# Patient Record
Sex: Male | Born: 1995 | Race: White | Hispanic: No | Marital: Single | Smoking: Never smoker
Health system: Southern US, Community
[De-identification: ages and names within clinical notes are randomized; demographics above are authoritative.]

---
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
## 2020-02-08 ENCOUNTER — Other Ambulatory Visit: Payer: Self-pay

## 2020-02-08 ENCOUNTER — Ambulatory Visit
Admission: EM | Admit: 2020-02-08 | Discharge: 2020-02-08 | Disposition: A | Discharge status: Home / Self Care | Payer: Commercial Managed Care - PPO | Attending: Emergency Medicine | Admitting: Emergency Medicine

## 2020-02-08 DIAGNOSIS — L237 Allergic contact dermatitis due to plants, except food: Secondary | ICD-10-CM

## 2020-02-08 MED ORDER — PREDNISONE 10 MG (21) PO TBPK: ORAL_TABLET | Freq: Every day | ORAL | 0 refills | Status: AC

## 2020-02-08 MED ORDER — CLOTRIMAZOLE 1 % EX CREA: TOPICAL_CREAM | CUTANEOUS | 0 refills | Status: AC

## 2020-02-08 MED ORDER — TRIAMCINOLONE ACETONIDE 0.1 % EX CREA: 1.0000 "application " | TOPICAL_CREAM | Freq: Two times a day (BID) | CUTANEOUS | 0 refills | Status: AC

## 2020-02-08 NOTE — ED Provider Notes (Signed)
[facility]    CSN: [id number] Arrival date & time: 02/08/20  1126      History   Chief Complaint Chief Complaint  Patient presents with  . Rash    HPI Jerry Mayer is a 24 y.o. male  Presenting for poison oak dermatitis.  States he was working in the woods last week: Initially evaluated given steroid shot.  Took "a few pills of Benadryl once daily thereafter.  States he is taking prednisone before and usually "feels something "when he takes it.  Unsure of the dose.  Has finished prednisone course a few days ago.  Having itching all over face, inner thighs.  Denies penile or testicular pain, discharge.  No fever, arthralgias, myalgias.  Did not wash dogs or all linens after this initially occurred.  Denies chest pain, palpitations, difficulty breathing.  History reviewed. No pertinent past medical history.  Patient Active Problem List   Diagnosis Date Noted  . Short stature 08/27/2010  . 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 No pertinent surgical history.     Home Medications    Prior to Admission medications   Medication Sig Start Date End Date Taking? Authorizing Provider  clotrimazole (LOTRIMIN) 1 % cream Apply to affected area 2 times daily 02/08/20   Hall-Potvin, [place], PA-C  methylphenidate (CONCERTA) 18 MG CR tablet Take 18 mg by mouth every morning.      [provider]  predniSONE (STERAPRED UNI-PAK 21 TAB) 10 MG (21) TBPK tablet Take by mouth daily. Take steroid taper as written 02/08/20   Hall-Potvin, [place], PA-C  triamcinolone cream (KENALOG) 0.1 % Apply 1 application topically 2 (two) times daily. 02/08/20   Hall-Potvin, [place], PA-C    Family History Family History  Problem Relation Age of Onset  . Healthy Mother   . Healthy Father     Social History Social History   Tobacco Use  . Smoking status: Never Smoker  . Smokeless  tobacco: Never Used  Substance Use Topics  . Alcohol use: Yes    Comment: occ  . Drug use: Not on file     Allergies   Patient has no known allergies.   Review of Systems As per HPI   Physical Exam Triage Vital Signs ED Triage Vitals  Enc Vitals Group     BP      Pulse      Resp      Temp      Temp src      SpO2      Weight      Height      Head Circumference      Peak Flow      Pain Score      Pain Loc      Pain Edu?      Excl. in GC?    No data found.  Updated Vital Signs BP 126/77 (BP Location: Left Arm)   Pulse 62   Temp 98.4 F (36.9 C) (Oral)   Resp 16   SpO2 100%   Visual Acuity Right Eye Distance:   Left Eye Distance:   Bilateral Distance:    Right Eye Near:   Left Eye Near:    Bilateral Near:     Physical Exam Constitutional:      General: He is not in acute distress. HENT:     Head: Normocephalic and atraumatic.  Eyes:     General:  No scleral icterus.    Extraocular Movements: Extraocular movements intact.     Conjunctiva/sclera: Conjunctivae normal.     Pupils: Pupils are equal, round, and reactive to light.  Cardiovascular:     Rate and Rhythm: Normal rate.  Pulmonary:     Effort: Pulmonary effort is normal. No respiratory distress.     Breath sounds: No wheezing.  Skin:    Coloration: Skin is not jaundiced or pale.     Findings: Rash present.     Comments: Diffuse over face.  No periorbital edema.  Neurological:     Mental Status: He is alert and oriented to person, place, and time.      UC Treatments / Results  Labs (all labs ordered are listed, but only abnormal results are displayed) Labs Reviewed - No data to display  EKG   Radiology No results found.  Procedures Procedures (including critical care time)  Medications Ordered in UC Medications - No data to display  Initial Impression / Assessment and Plan / UC Course  I have reviewed the triage vital signs and the nursing notes.  Pertinent labs &  imaging results that were available during my care of the patient were reviewed by me and considered in my medical decision making (see chart for details).     Patient with reportedly low dose of prednisone.  Will do taper for poison oak dermatitis.  Low concern for orbital complication at this time.  Return precautions discussed, pt verbalized understanding and is agreeable to plan. Final Clinical Impressions(s) / UC Diagnoses   Final diagnoses:  Poison oak     Discharge Instructions     Keep skin clean - may use gentle soaps without perfumes/dyes. Avoid hot water (showers, baths) as this can further dry out and irritate skin. Pat skin dry as rubbing can irritate and tear skin.    ED Prescriptions    Medication Sig Dispense Auth. Provider   predniSONE (STERAPRED UNI-PAK 21 TAB) 10 MG (21) TBPK tablet Take by mouth daily. Take steroid taper as written 21 tablet Hall-Potvin, [place], PA-C   triamcinolone cream (KENALOG) 0.1 % Apply 1 application topically 2 (two) times daily. 30 g Hall-Potvin, [place], PA-C   clotrimazole (LOTRIMIN) 1 % cream Apply to affected area 2 times daily 15 g Hall-Potvin, [place], PA-C     PDMP not reviewed this encounter.   Hall-Potvin, [place], [place] 02/08/20 1215

## 2020-02-08 NOTE — ED Triage Notes (Signed)
Pt presents with complaints of rash to arms bilaterally. Reports he had poision oak last week and got a shot and steroids. He was feeling better but started having a lot of itching all over where the rash was last night. Reports he completed the prednisone. Reports he took benadryl prior to arrival. Pt denies any swelling or difficulty breathing.

## 2020-02-08 NOTE — Discharge Instructions (Addendum)
Keep skin clean - may use gentle soaps without perfumes/dyes. Avoid hot water (showers, baths) as this can further dry out and irritate skin. Pat skin dry as rubbing can irritate and tear skin.
